# Patient Record
Sex: Male | Born: 1995 | Race: White | Hispanic: No
Health system: Southern US, Community
[De-identification: ages and names within clinical notes are randomized; demographics above are authoritative.]

---

## 2018-03-22 ENCOUNTER — Other Ambulatory Visit: Payer: Self-pay

## 2018-03-22 ENCOUNTER — Emergency Department: Payer: 59

## 2018-03-22 ENCOUNTER — Emergency Department
Admission: EM | Admit: 2018-03-22 | Discharge: 2018-03-23 | Disposition: A | Discharge status: Home / Self Care | Payer: 59 | Attending: Emergency Medicine | Admitting: Emergency Medicine

## 2018-03-22 DIAGNOSIS — S299XXA Unspecified injury of thorax, initial encounter: Secondary | ICD-10-CM | POA: Diagnosis present

## 2018-03-22 DIAGNOSIS — Y999 Unspecified external cause status: Secondary | ICD-10-CM | POA: Insufficient documentation

## 2018-03-22 DIAGNOSIS — W1842XA Slipping, tripping and stumbling without falling due to stepping into hole or opening, initial encounter: Secondary | ICD-10-CM | POA: Insufficient documentation

## 2018-03-22 DIAGNOSIS — Y9301 Activity, walking, marching and hiking: Secondary | ICD-10-CM | POA: Insufficient documentation

## 2018-03-22 DIAGNOSIS — S2232XA Fracture of one rib, left side, initial encounter for closed fracture: Secondary | ICD-10-CM | POA: Diagnosis not present

## 2018-03-22 DIAGNOSIS — Y929 Unspecified place or not applicable: Secondary | ICD-10-CM | POA: Diagnosis not present

## 2018-03-22 DIAGNOSIS — S4990XA Unspecified injury of shoulder and upper arm, unspecified arm, initial encounter: Secondary | ICD-10-CM | POA: Diagnosis not present

## 2018-03-22 MED ORDER — HYDROCODONE-ACETAMINOPHEN 5-325 MG PO TABS: 1.0000 | ORAL_TABLET | Freq: Four times a day (QID) | ORAL | 0 refills | Status: AC | PRN

## 2018-03-22 MED ORDER — CYCLOBENZAPRINE HCL 10 MG PO TABS: 10.0000 mg | ORAL_TABLET | Freq: Three times a day (TID) | ORAL | 0 refills | Status: AC | PRN

## 2018-03-22 MED ORDER — ORPHENADRINE CITRATE 30 MG/ML IJ SOLN
60.0000 mg | Freq: Two times a day (BID) | INTRAMUSCULAR | Status: DC
  Filled 2018-03-22: qty 2

## 2018-03-22 MED ORDER — CYCLOBENZAPRINE HCL 10 MG PO TABS
10.0000 mg | ORAL_TABLET | Freq: Once | ORAL | Status: AC
  Administered 2018-03-22: 10 mg via ORAL
  Filled 2018-03-22: qty 1

## 2018-03-22 MED ORDER — IBUPROFEN 600 MG PO TABS
600.0000 mg | ORAL_TABLET | Freq: Once | ORAL | Status: AC
  Administered 2018-03-22: 600 mg via ORAL
  Filled 2018-03-22: qty 1

## 2018-03-22 MED ORDER — HYDROCODONE-ACETAMINOPHEN 5-325 MG PO TABS
1.0000 | ORAL_TABLET | Freq: Once | ORAL | Status: AC
  Administered 2018-03-22: 1 via ORAL
  Filled 2018-03-22: qty 1

## 2018-03-22 MED ORDER — IBUPROFEN 600 MG PO TABS: 600.0000 mg | ORAL_TABLET | Freq: Four times a day (QID) | ORAL | 0 refills | Status: AC | PRN

## 2018-03-22 NOTE — Discharge Instructions (Signed)
Please call and schedule a follow up appointment with orthopedics.  Return to the ER for symptoms that change or worsen or for new concerns if unable to see orthopedics or primary care.

## 2018-03-22 NOTE — ED Triage Notes (Signed)
Reports stepping in a hole and fell.  Patient reports left shoulder pain, left ribs and left back pain.

## 2018-03-22 NOTE — ED Provider Notes (Signed)
[facility] Emergency Department Provider Note ____________________________________________  Time seen: Approximately 9:15 PM  I have reviewed the triage vital signs and the nursing notes.   HISTORY  Chief Complaint Fall    HPI RAMERE MANELLA is a 23 y.o. male who presents to the emergency department for evaluation and treatment of left shoulder, left rib and left back pain.  Patient states he was walking and did not see a hole in the ground.  This caused him to be off balance and fall.  He did not strike his head or lose consciousness.  No past medical history on file.  There are no active problems to display for this patient.   Prior to Admission medications   Medication Sig Start Date End Date Taking? Authorizing Provider  cyclobenzaprine (FLEXERIL) 10 MG tablet Take 1 tablet (10 mg total) by mouth 3 (three) times daily as needed for muscle spasms. 03/22/18   Amirrah Quigley, Rulon Eisenmenger B, FNP  HYDROcodone-acetaminophen (NORCO/VICODIN) 5-325 MG tablet Take 1 tablet by mouth every 6 (six) hours as needed for up to 3 days. 03/22/18 03/25/18  Taniesha Glanz, Kasandra Knudsen, FNP  ibuprofen (ADVIL,MOTRIN) 600 MG tablet Take 1 tablet (600 mg total) by mouth every 6 (six) hours as needed. 03/22/18   Kem Boroughs B, FNP    Allergies Oxycodone  No family history on file.  Social History Social History   Tobacco Use  . Smoking status: Not on file  Substance Use Topics  . Alcohol use: Not on file  . Drug use: Not on file    Review of Systems Constitutional: Negative for fever. Cardiovascular: Negative for chest pain. Respiratory: Negative for shortness of breath. Musculoskeletal: Positive for left shoulder, left thorax, and left back pain.  Positive for right ankle and right lower extremity pain. Skin: Positive for abrasions to the right lower extremity. Neurological: Negative for decrease in sensation  ____________________________________________   PHYSICAL EXAM:  VITAL  SIGNS: ED Triage Vitals  Enc Vitals Group     BP 03/22/18 2015 113/78     Pulse Rate 03/22/18 2015 91     Resp 03/22/18 2015 20     Temp 03/22/18 2015 97.8 F (36.6 C)     Temp Source 03/22/18 2015 Oral     SpO2 03/22/18 2015 98 %     Weight 03/22/18 2015 170 lb (77.1 kg)     Height 03/22/18 2015 6\' 9"  (2.057 m)     Head Circumference --      Peak Flow --      Pain Score 03/22/18 2014 8     Pain Loc --      Pain Edu? --      Excl. in GC? --     Constitutional: Alert and oriented. Well appearing and in no acute distress. Eyes: Conjunctivae are clear without discharge or drainage Head: Atraumatic Neck: Supple.  No midline tenderness. Respiratory: No cough. Respirations are even and unlabored. Musculoskeletal: No step-off deformity noted of the left shoulder, however there is tenderness over the superior aspect and AC area.  Tenderness to the mid humerus without obvious deformity.  Tenderness over the lateral chest wall and parathoracic area on the left side.  Obvious muscle spasm of the parathoracic muscles on the left side.  Mild swelling over the medial aspect of the right ankle with full range of motion demonstrated.  Antalgic assisted gait is observed. Neurologic: Awake, alert, oriented x4. Skin: Abrasions noted to the pretibial surface of the right lower extremity as  well as a small abrasion on the medial aspect of the right ankle. Psychiatric: Affect and behavior are appropriate.  ____________________________________________   LABS (all labs ordered are listed, but only abnormal results are displayed)  Labs Reviewed - No data to display ____________________________________________  RADIOLOGY  Image of the left shoulder, left ribs including chest, and thorax are negative for acute bony abnormalities with the exception of a AC joint separation.  On my review, there appears to be a rib fracture in the area of greatest  pain. ____________________________________________   PROCEDURES  Procedures  ____________________________________________   INITIAL IMPRESSION / ASSESSMENT AND PLAN / ED COURSE  Tresa MooreDaniel C Ganas is a 23 y.o. who presents to the emergency department for treatment and evaluation of musculoskeletal pain after a mechanical, non-syncopal fall prior to arrival.  Images are reassuring with the exception of an [facility] separation.  He will be placed in a sling.  He will also be provided Flexeril, Norco, and ibuprofen.  He was encouraged to ice the shoulder and other areas that are tender.  He was advised to call and schedule follow-up appointment with orthopedics.  He was encouraged to return to the emergency department for symptoms of change or worsen if he is unable to schedule an appointment with primary care or orthopedics.  Medications  HYDROcodone-acetaminophen (NORCO/VICODIN) 5-325 MG per tablet 1 tablet (has no administration in time range)  ibuprofen (ADVIL,MOTRIN) tablet 600 mg (has no administration in time range)  cyclobenzaprine (FLEXERIL) tablet 10 mg (10 mg Oral Given 03/22/18 2206)    Pertinent labs & imaging results that were available during my care of the patient were reviewed by me and considered in my medical decision making (see chart for details).  _________________________________________   FINAL CLINICAL IMPRESSION(S) / ED DIAGNOSES  Final diagnoses:  Acromioclavicular joint injury, initial encounter  Closed fracture of one rib of left side, initial encounter    ED Discharge Orders         Ordered    HYDROcodone-acetaminophen (NORCO/VICODIN) 5-325 MG tablet  Every 6 hours PRN     03/22/18 2311    cyclobenzaprine (FLEXERIL) 10 MG tablet  3 times daily PRN     03/22/18 2311    ibuprofen (ADVIL,MOTRIN) 600 MG tablet  Every 6 hours PRN     03/22/18 2311           If controlled substance prescribed during this visit, 12 month history viewed on the [facility] prior  to issuing an initial prescription for Schedule II or III opiod.    Chinita Pesterriplett, Mackayla Mullins B, FNP 03/22/18 [id number]    Sharman CheekStafford, Phillip, MD 03/28/18 [phone]

## 2018-03-23 NOTE — ED Notes (Signed)
E-signature not working at this time. Pt verbalized understanding of D/C instructions and follow up care. No further questions at this time. Pt in NAD at time of D/C. 

## 2019-06-09 IMAGING — CR DG THORACIC SPINE 2V
1 series · 5 of 5 positions shown · non-contrast
Comparison: None.

CLINICAL DATA: Fall this evening with back pain

EXAM:
THORACIC SPINE 2 VIEWS

[Series 1: dg thoracic spine 2 view · 0.14mm/px · 5 of 5 slices shown]
[im 1/5]
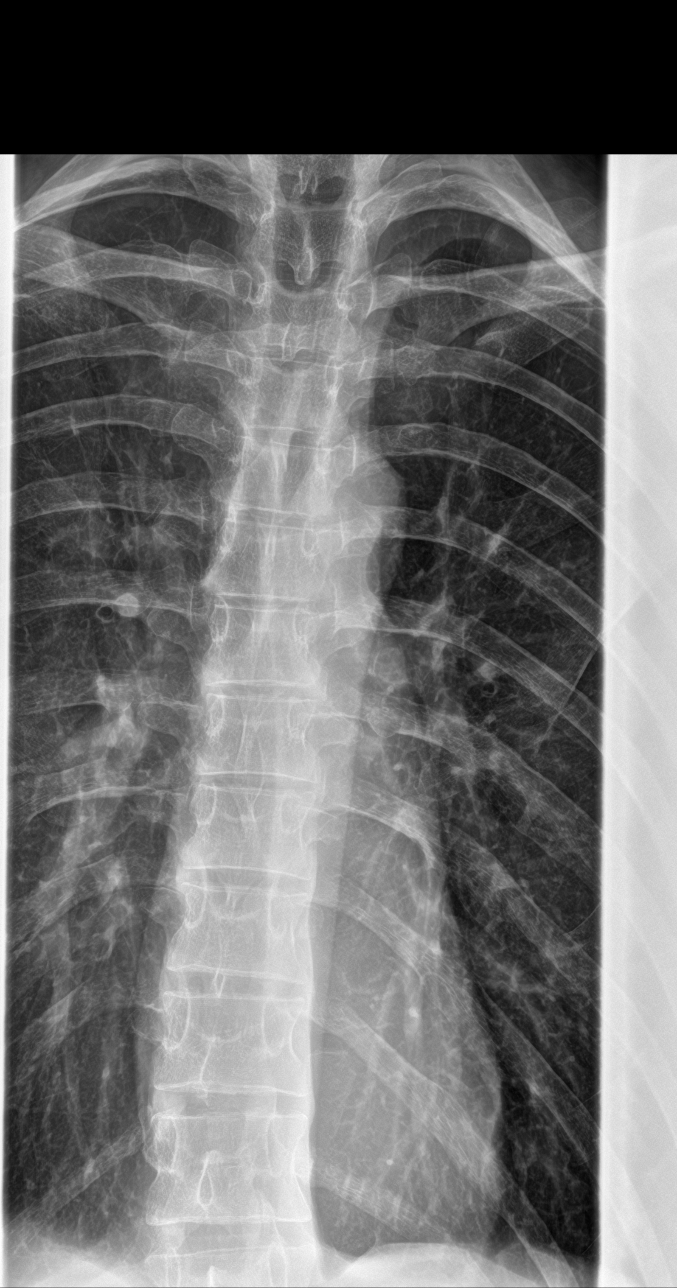
[im 2/5]
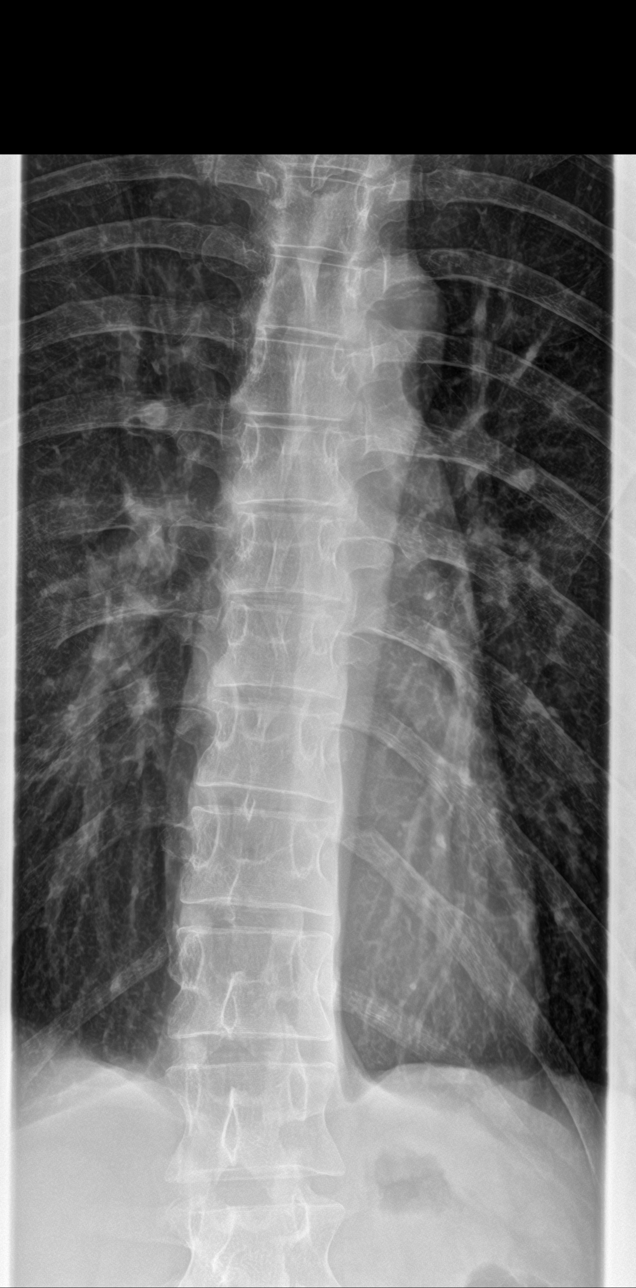
[im 3/5]
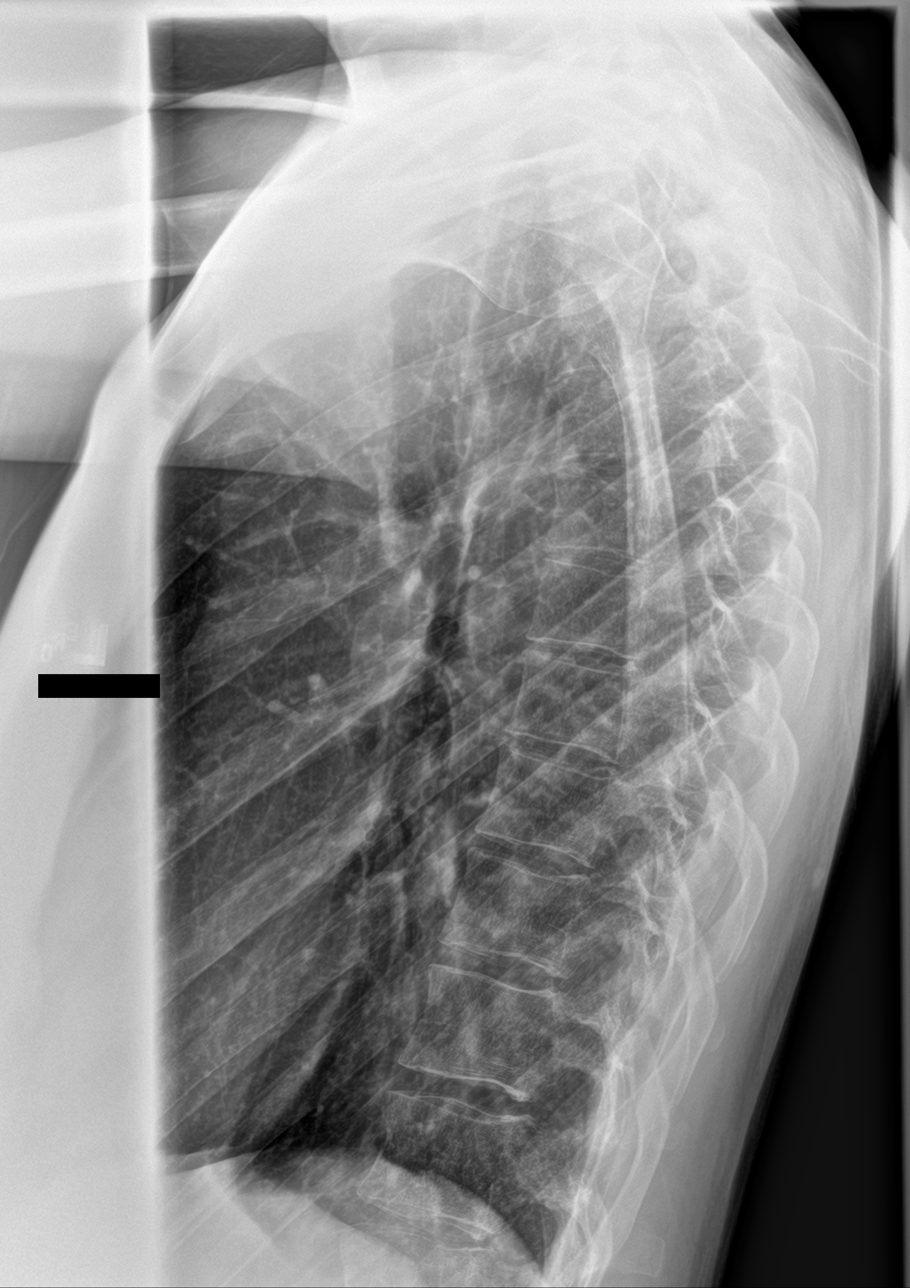
[im 4/5]
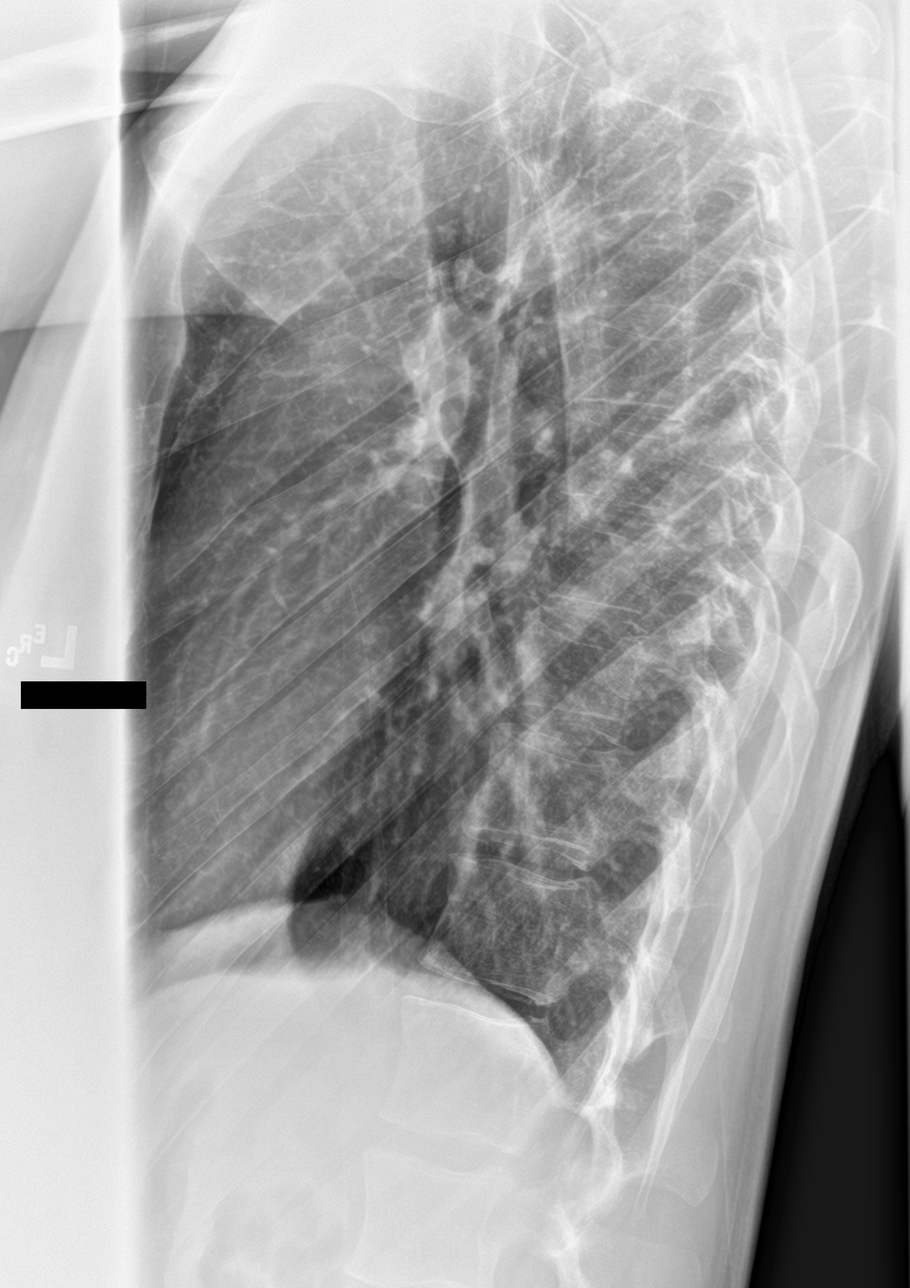
[im 5/5]
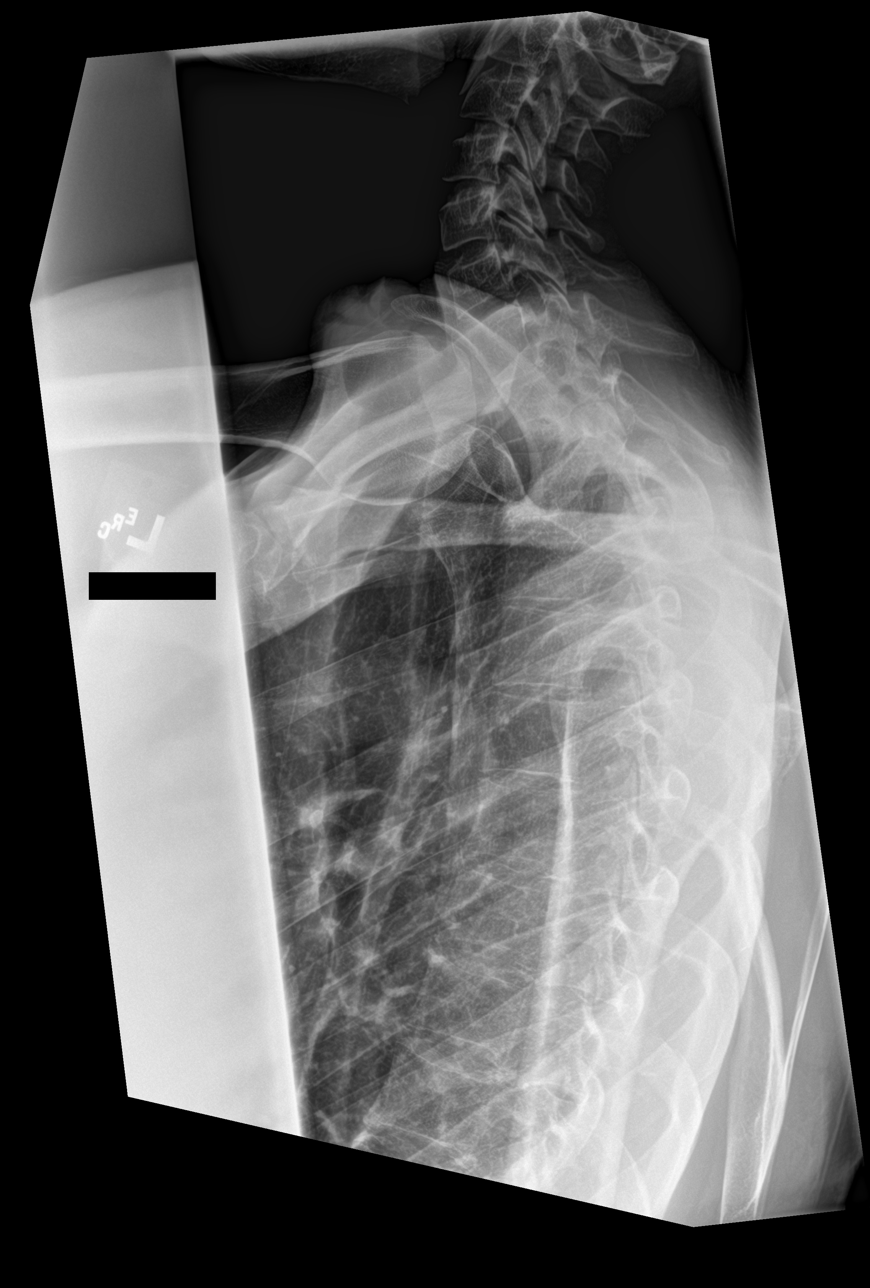

[5 of 5 positions shown; findings below may reference images not displayed]

FINDINGS: There is no evidence of thoracic spine fracture. Alignment is
normal. No other significant bone abnormalities are identified.
IMPRESSION: Negative.

## 2019-06-09 IMAGING — CR DG RIBS W/ CHEST 3+V*L*
1 series · 5 of 5 positions shown · non-contrast
Comparison: None.

CLINICAL DATA: Fall this evening onto left side with pain

EXAM:
LEFT RIBS AND CHEST - 3+ VIEW

[Series 1: dg ribs unilateral w/chest left · 0.14mm/px · 5 of 5 slices shown]
[im 1/5]
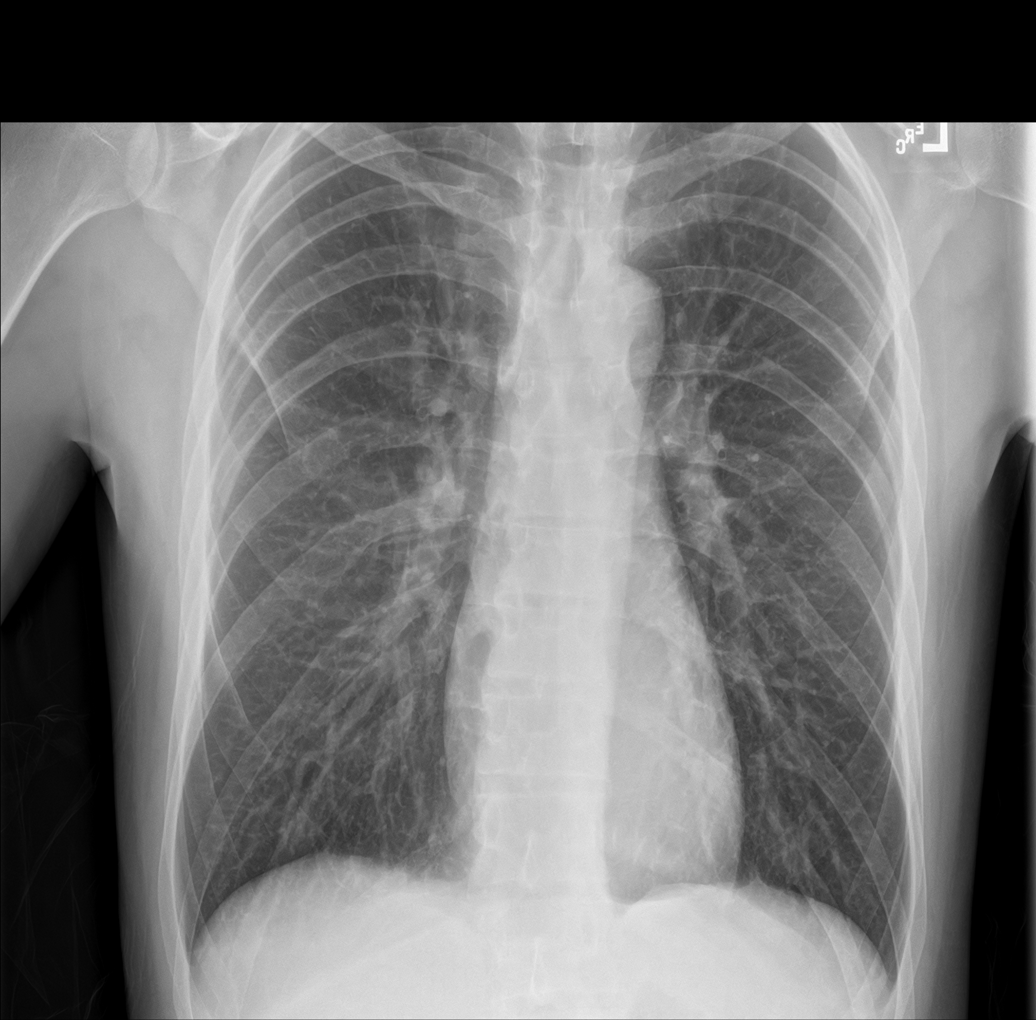
[im 2/5]
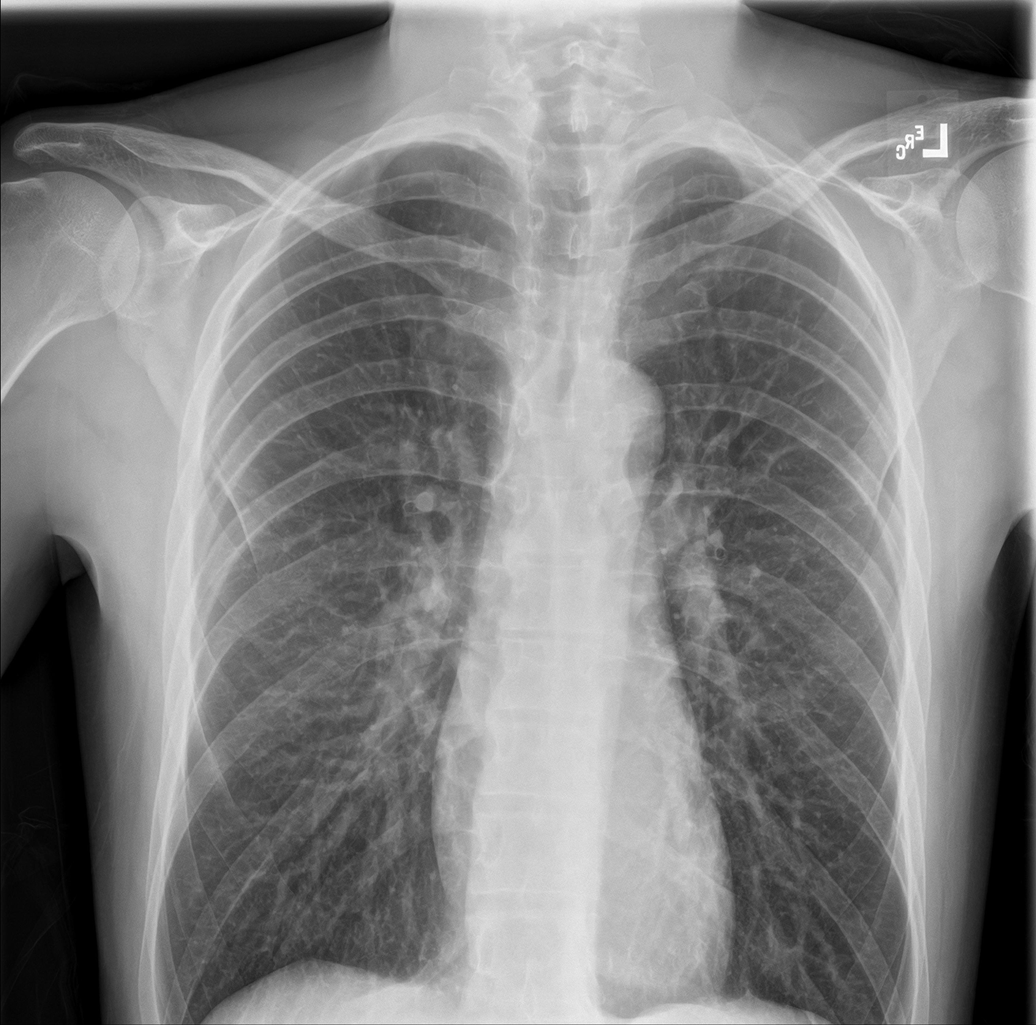
[im 3/5]
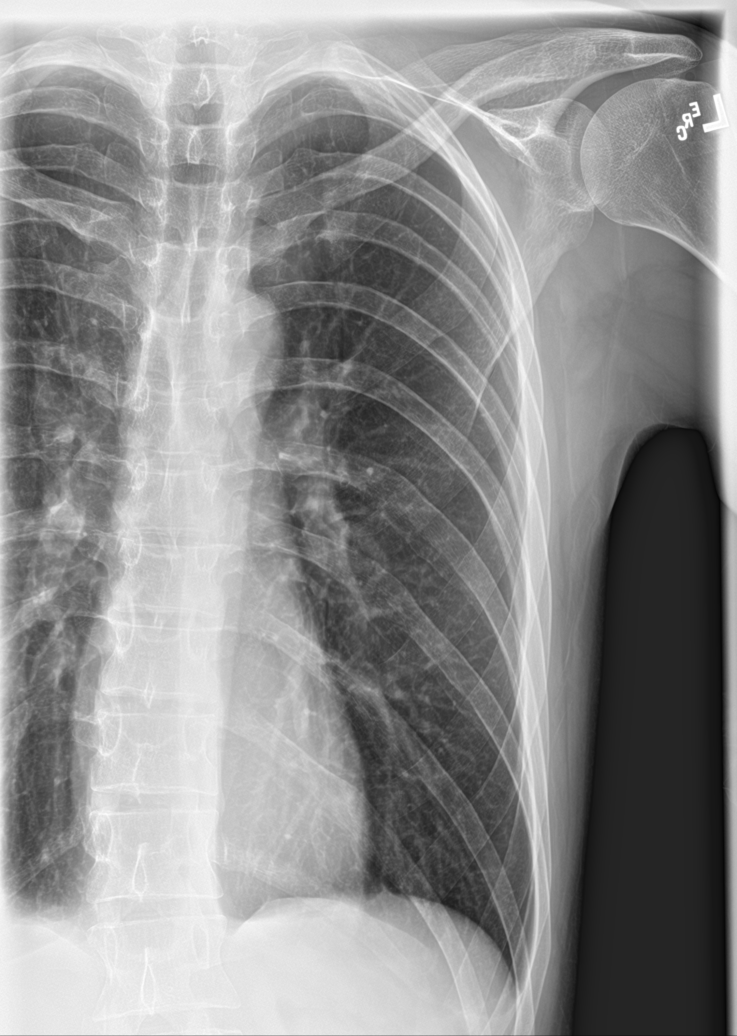
[im 4/5]
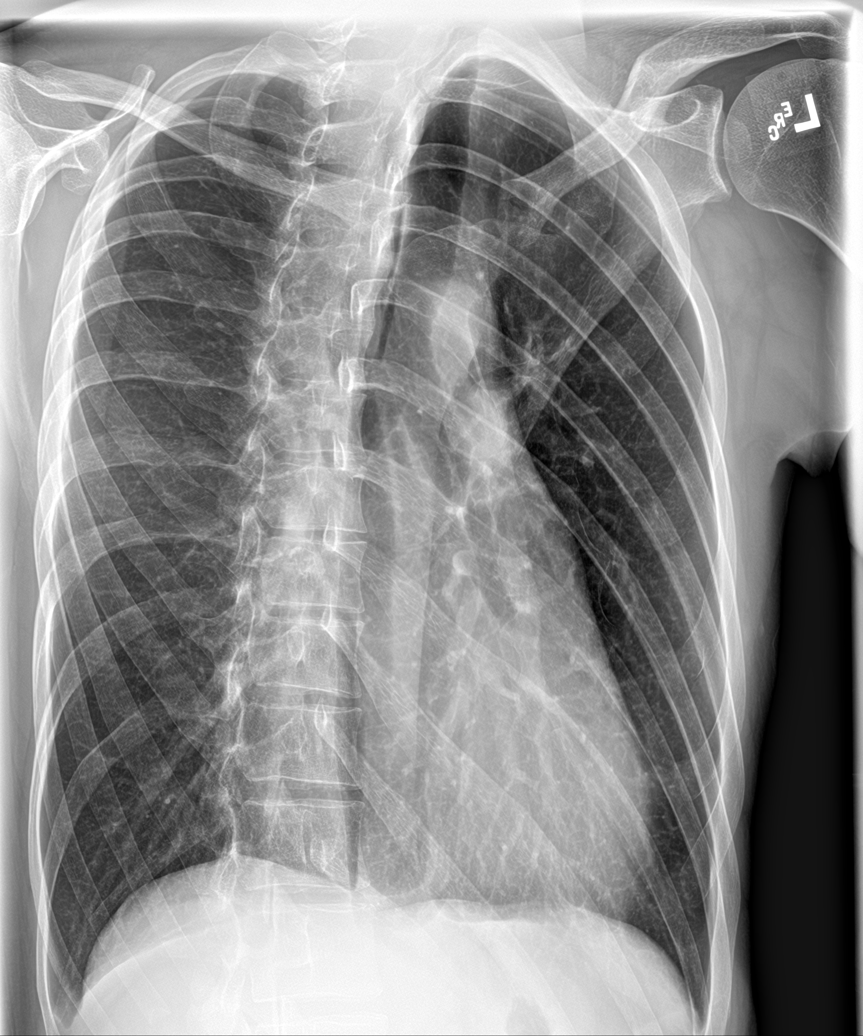
[im 5/5]
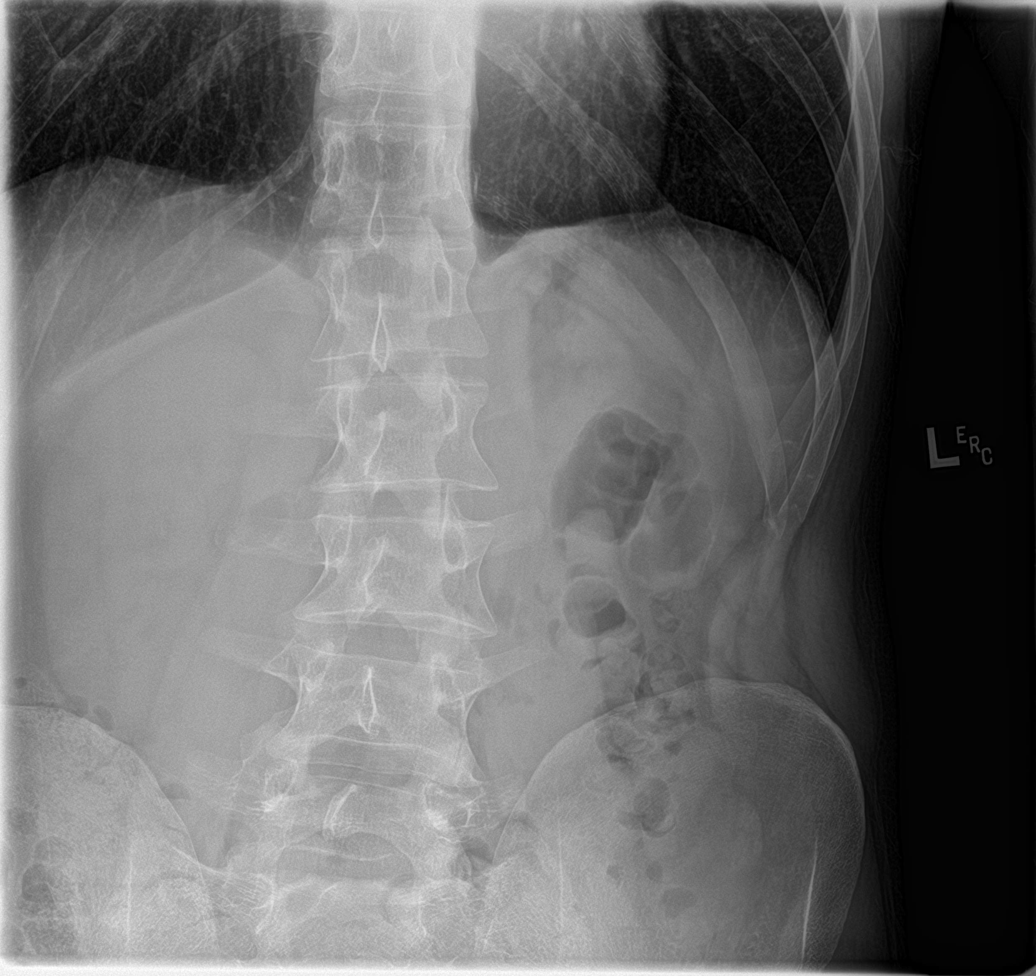

[5 of 5 positions shown; findings below may reference images not displayed]

FINDINGS: Normal heart size. Normal mediastinal contour. No pneumothorax. No
pleural effusion. Lungs appear clear, with no acute consolidative
airspace disease and no pulmonary edema. No fracture or focal
osseous lesion seen in the left ribs.
IMPRESSION: No active disease in the chest. No left rib fracture detected.
Should the patient's symptoms persist or worsen, repeat radiographs
of the ribs in 10 - 14 days maybe of use to detect subtle
nondisplaced rib fractures (which are commonly occult on initial
imaging).
